# Patient Record
Sex: Female | Born: 1985 | Race: Black or African American | Hispanic: No | Marital: Single | Smoking: Never smoker
Health system: Southern US, Community
[De-identification: ages and names within clinical notes are randomized; demographics above are authoritative.]

## PROBLEM LIST (undated history)

## (undated) DIAGNOSIS — D649 Anemia, unspecified: Secondary | ICD-10-CM

---

## 2009-12-07 ENCOUNTER — Emergency Department (HOSPITAL_BASED_OUTPATIENT_CLINIC_OR_DEPARTMENT_OTHER)
Admission: EM | Admit: 2009-12-07 | Discharge: 2009-12-07 | Payer: Self-pay | Source: Home / Self Care | Admitting: Emergency Medicine

## 2010-06-26 LAB — COMPREHENSIVE METABOLIC PANEL
Albumin: 4.4 g/dL (ref 3.5–5.2)
CO2: 26 mEq/L (ref 19–32)
Chloride: 107 mEq/L (ref 96–112)
Glucose, Bld: 82 mg/dL (ref 70–99)

## 2010-06-26 LAB — DIFFERENTIAL
Basophils Relative: 3 % — ABNORMAL HIGH (ref 0–1)
Eosinophils Absolute: 0 10*3/uL (ref 0.0–0.7)
Lymphocytes Relative: 30 % (ref 12–46)
Monocytes Absolute: 0.3 10*3/uL (ref 0.1–1.0)

## 2010-06-26 LAB — URINALYSIS, ROUTINE W REFLEX MICROSCOPIC
Glucose, UA: NEGATIVE mg/dL
Specific Gravity, Urine: 1.014 (ref 1.005–1.030)
pH: 7.5 (ref 5.0–8.0)

## 2010-06-26 LAB — CBC
Hemoglobin: 10.4 g/dL — ABNORMAL LOW (ref 12.0–15.0)
MCHC: 31.4 g/dL (ref 30.0–36.0)
MCV: 69.7 fL — ABNORMAL LOW (ref 78.0–100.0)
Platelets: 327 10*3/uL (ref 150–400)
RBC: 4.77 MIL/uL (ref 3.87–5.11)
WBC: 5.5 10*3/uL (ref 4.0–10.5)

## 2010-06-26 LAB — URINE MICROSCOPIC-ADD ON

## 2010-06-26 LAB — PREGNANCY, URINE: Preg Test, Ur: NEGATIVE

## 2011-10-09 ENCOUNTER — Emergency Department (HOSPITAL_BASED_OUTPATIENT_CLINIC_OR_DEPARTMENT_OTHER)
Admission: EM | Admit: 2011-10-09 | Discharge: 2011-10-09 | Disposition: A | Discharge status: Home / Self Care | Payer: 59 | Attending: Emergency Medicine | Admitting: Emergency Medicine

## 2011-10-09 ENCOUNTER — Encounter (HOSPITAL_BASED_OUTPATIENT_CLINIC_OR_DEPARTMENT_OTHER): Payer: Self-pay

## 2011-10-09 DIAGNOSIS — N390 Urinary tract infection, site not specified: Secondary | ICD-10-CM | POA: Insufficient documentation

## 2011-10-09 DIAGNOSIS — R109 Unspecified abdominal pain: Secondary | ICD-10-CM | POA: Insufficient documentation

## 2011-10-09 DIAGNOSIS — N72 Inflammatory disease of cervix uteri: Secondary | ICD-10-CM | POA: Insufficient documentation

## 2011-10-09 HISTORY — DX: Anemia, unspecified: D64.9

## 2011-10-09 LAB — WET PREP, GENITAL: Trich, Wet Prep: NONE SEEN

## 2011-10-09 LAB — URINALYSIS, ROUTINE W REFLEX MICROSCOPIC
Bilirubin Urine: NEGATIVE
Glucose, UA: NEGATIVE mg/dL
Ketones, ur: NEGATIVE mg/dL
Nitrite: NEGATIVE
Protein, ur: NEGATIVE mg/dL
Specific Gravity, Urine: 1.024 (ref 1.005–1.030)
Urobilinogen, UA: 0.2 mg/dL (ref 0.0–1.0)
pH: 7 (ref 5.0–8.0)

## 2011-10-09 LAB — PREGNANCY, URINE: Preg Test, Ur: NEGATIVE

## 2011-10-09 LAB — URINE MICROSCOPIC-ADD ON

## 2011-10-09 MED ORDER — METRONIDAZOLE 500 MG PO TABS
2000.0000 mg | ORAL_TABLET | Freq: Once | ORAL | Status: AC
  Administered 2011-10-09: 2000 mg via ORAL
  Filled 2011-10-09: qty 4

## 2011-10-09 MED ORDER — CEPHALEXIN 500 MG PO CAPS: 500.0000 mg | ORAL_CAPSULE | Freq: Four times a day (QID) | ORAL | Status: AC

## 2011-10-09 MED ORDER — AZITHROMYCIN 250 MG PO TABS
1000.0000 mg | ORAL_TABLET | Freq: Once | ORAL | Status: AC
  Administered 2011-10-09: 1000 mg via ORAL
  Filled 2011-10-09: qty 4

## 2011-10-09 MED ORDER — CEFTRIAXONE SODIUM 250 MG IJ SOLR
250.0000 mg | Freq: Once | INTRAMUSCULAR | Status: AC
  Administered 2011-10-09: 250 mg via INTRAMUSCULAR
  Filled 2011-10-09: qty 250

## 2011-10-09 MED ORDER — FLUCONAZOLE 150 MG PO TABS: ORAL_TABLET | ORAL | Status: DC

## 2011-10-09 NOTE — ED Provider Notes (Signed)
History     CSN: [id number]  Arrival date & time 10/09/11  4098   First MD Initiated Contact with Patient 10/09/11 1017      Chief Complaint  Patient presents with  . Abdominal Pain    (Consider location/radiation/quality/duration/timing/severity/associated sxs/prior treatment) HPI Patient is a 26 year old female who presents today complaining of suprapubic pain as well as vaginal discharge and increased urinary frequency. Patient was seen recently at another facility and diagnosed with UTI. She was treated with an antibiotic which she cannot recall the name of. She reported that she stopped taking this antibiotic after she started feeling better. She did not complete her course. Patient also reports dyspareunia with a new sexual partner last week. They engaged in unprotected vaginal intercourse. Patient has never had pain like this before and did not feel that her partner was anatomically significantly different from previous partners. Patient has no history of sexual transmitted diseases or pregnancy. She was hemodynamically stable and denied nausea, vomiting, or fevers. Patient has not taken a thing for her symptoms. There are no other associated or modifying factors. Past Medical History  Diagnosis Date  . Anemia     History reviewed. No pertinent past surgical history.  History reviewed. No pertinent family history.  History  Substance Use Topics  . Smoking status: Never Smoker   . Smokeless tobacco: Not on file  . Alcohol Use: No    OB History    Grav Para Term Preterm Abortions TAB SAB Ect Mult Living                  Review of Systems  Constitutional: Negative.   HENT: Negative.   Eyes: Negative.   Respiratory: Negative.   Cardiovascular: Negative.   Gastrointestinal: Positive for abdominal pain.  Genitourinary: Positive for frequency, vaginal discharge and dyspareunia.  Musculoskeletal: Negative.   Skin: Negative.   Neurological: Negative.   Hematological:  Negative.   Psychiatric/Behavioral: Negative.   All other systems reviewed and are negative.    Allergies  Review of patient's allergies indicates no known allergies.  Home Medications   Current Outpatient Rx  Name Route Sig Dispense Refill  . CEPHALEXIN 500 MG PO CAPS Oral Take 1 capsule (500 mg total) by mouth 4 (four) times daily. 40 capsule 0    BP 135/67  Pulse 84  Temp 98.3 F (36.8 C) (Oral)  Resp 15  Ht 5\' 2"  (1.575 m)  Wt 260 lb (117.935 kg)  BMI 47.55 kg/m2  SpO2 100%  LMP 09/18/2011  Physical Exam  Nursing note and vitals reviewed. GEN: Well-developed, well-nourished female in no distress HEENT: Atraumatic, normocephalic. Oropharynx clear without erythema EYES: PERRLA BL, no scleral icterus. NECK: Trachea midline, no meningismus CV: regular rate and rhythm. No murmurs, rubs, or gallops PULM: No respiratory distress.  No crackles, wheezes, or rales. GI: soft, mild suprapubic tenderness. No guarding, rebound. + bowel sounds  GU: Moderate amount of yellowish vaginal discharge. Cervical motion tenderness noted. No adnexal motion tenderness. Neuro: cranial nerves grossly 2-12 intact, no abnormalities of strength or sensation, A and O x 3 MSK: Patient moves all 4 extremities symmetrically, no deformity, edema, or injury noted Skin: No rashes petechiae, purpura, or jaundice Psych: no abnormality of mood   ED Course  Procedures (including critical care time)  Labs Reviewed  URINALYSIS, ROUTINE W REFLEX MICROSCOPIC - Abnormal; Notable for the following:    APPearance CLOUDY (*)     Hgb urine dipstick LARGE (*)     Leukocytes,  UA LARGE (*)     All other components within normal limits  WET PREP, GENITAL - Abnormal; Notable for the following:    Clue Cells Wet Prep HPF POC FEW (*)     WBC, Wet Prep HPF POC TOO NUMEROUS TO COUNT (*)     All other components within normal limits  URINE MICROSCOPIC-ADD ON - Abnormal; Notable for the following:    Squamous  Epithelial / LPF MANY (*)     Bacteria, UA MANY (*)     All other components within normal limits  PREGNANCY, URINE  GC/CHLAMYDIA PROBE AMP, GENITAL  URINE CULTURE   No results found.   1. UTI (urinary tract infection)   2. Cervicitis       MDM  Patient was evaluated by myself. Based on evaluation patient did have urinalysis performed which showed large amounts of leukocytes and white blood cells. Patient was having urinary symptoms but given her exposure and dyspareunia a pelvic exam was performed by myself. This was consistent with cervicitis. Patient was notified of my concern. Her pregnancy test was negative. Patient was treated with Rocephin, azithromycin, and Flagyl here. She was told that she should notify her partners for the possibility of sexual transmitted infection. Patient was advised to engage only in protected sexual intercourse. Gonorrhea and Chlamydia swabs are pending. Patient was also treated for urinary tract infection as her symptoms were somewhat similar to previous. A urine culture was also sent. Patient was given prescription for Keflex. She also requests a prescription for Diflucan in case she develops yeast infection. I was happy to provide this. Patient was discharged in good condition.        Cyndra Numbers, MD 10/10/11 1721

## 2011-10-09 NOTE — ED Notes (Signed)
Pt c/o suprapubic abdominal cramping.  Pt states she was recently DX and TX for UTI, completed ABX but still has some abdominal cramping.  Pt denies dysuria, hematuria, increased frequency.  Pt denies fever.

## 2011-10-09 NOTE — Discharge Instructions (Signed)
Cervicitis Cervicitis is a soreness and swelling (inflammation) of the cervix. Your cervix is located at the bottom of your uterus which opens up to the vagina.  CAUSES   Sexually transmitted infections (STIs).   Allergic reaction.   Medicines or birth control devices that are put in the vagina.   Injury to the cervix.   Bacterial infections.  SYMPTOMS  There may be no symptoms. If symptoms occur, they may include:  Grey, white, yellow, or bad smelling vaginal discharge.   Pain or itching of the area outside the vagina.   Painful sexual intercourse.   Lower abdominal or lower back pain, especially during intercourse.   Frequent urination.   Abnormal vaginal bleeding between periods, after sexual intercourse, or after menopause.   Pressure or a heavy feeling in the pelvis.  DIAGNOSIS  Diagnosis is made after a pelvic exam. Other tests may include:  Examination of any discharge under a microscope (wet prep).   A Pap test.  TREATMENT  Treatment will depend on the cause of cervicitis. If it is caused by an STI, both you and your partner will need to be treated. Antibiotic medicines will be given. HOME CARE INSTRUCTIONS   Do not have sexual intercourse until your caregiver says it is okay.   Do not have sexual intercourse until your partner has been treated if your cervicitis is caused by an STI.   Take your antibiotics as directed. Finish them even if you start to feel better.  SEEK IMMEDIATE MEDICAL CARE IF:   Your symptoms come back.   You have a fever.   You experience any problems that may be related to the medicine you are taking.  MAKE SURE YOU:   Understand these instructions.   Will watch your condition.   Will get help right away if you are not doing well or get worse.  Document Released: 03/29/2005 Document Revised: 03/18/2011 Document Reviewed: 10/26/2010 [facility] Patient Information 2012 [place], [place].Urinary Tract Infection Infections of the  urinary tract can start in several places. A bladder infection (cystitis), a kidney infection (pyelonephritis), and a prostate infection (prostatitis) are different types of urinary tract infections (UTIs). They usually get better if treated with medicines (antibiotics) that kill germs. Take all the medicine until it is gone. You or your child may feel better in a few days, but TAKE ALL MEDICINE or the infection may not respond and may become more difficult to treat. HOME CARE INSTRUCTIONS   Drink enough water and fluids to keep the urine clear or pale yellow. Cranberry juice is especially recommended, in addition to large amounts of water.   Avoid caffeine, tea, and carbonated beverages. They tend to irritate the bladder.   Alcohol may irritate the prostate.   Only take over-the-counter or prescription medicines for pain, discomfort, or fever as directed by your caregiver.  To prevent further infections:  Empty the bladder often. Avoid holding urine for long periods of time.   After a bowel movement, women should cleanse from front to back. Use each tissue only once.   Empty the bladder before and after sexual intercourse.  FINDING OUT THE RESULTS OF YOUR TEST Not all test results are available during your visit. If your or your child's test results are not back during the visit, make an appointment with your caregiver to find out the results. Do not assume everything is normal if you have not heard from your caregiver or the medical facility. It is important for you to follow up  on all test results. SEEK MEDICAL CARE IF:   There is back pain.   Your baby is older than 3 months with a rectal temperature of 100.5 F (38.1 C) or higher for more than 1 day.   Your or your child's problems (symptoms) are no better in 3 days. Return sooner if you or your child is getting worse.  SEEK IMMEDIATE MEDICAL CARE IF:   There is severe back pain or lower abdominal pain.   You or your child  develops chills.   You have a fever.   Your baby is older than 3 months with a rectal temperature of 102 F (38.9 C) or higher.   Your baby is 94 months old or younger with a rectal temperature of 100.4 F (38 C) or higher.   There is nausea or vomiting.   There is continued burning or discomfort with urination.  MAKE SURE YOU:   Understand these instructions.   Will watch your condition.   Will get help right away if you are not doing well or get worse.  Document Released: 01/06/2005 Document Revised: 03/18/2011 Document Reviewed: 08/11/2006 [facility] Patient Information 2012 [place], [place].

## 2011-10-10 LAB — URINE CULTURE: Colony Count: 25000

## 2011-10-12 LAB — GC/CHLAMYDIA PROBE AMP, GENITAL
Chlamydia, DNA Probe: POSITIVE — AB
GC Probe Amp, Genital: NEGATIVE

## 2011-10-13 NOTE — ED Notes (Signed)
+   chlamydia Patient treated with  Rocephin and Zithromax letter faxed

## 2011-10-14 NOTE — ED Notes (Signed)
Voice mail message left for patient to return call. 

## 2011-10-16 NOTE — ED Notes (Signed)
Attempted to contact patient. No answer. Left voicemail for patient to call back. °

## 2011-10-17 NOTE — ED Notes (Signed)
Attempted to contact patient. No answer. Left voicemail for patient to call back. Sent letter after no answer x 3. °

## 2012-05-05 ENCOUNTER — Encounter (HOSPITAL_BASED_OUTPATIENT_CLINIC_OR_DEPARTMENT_OTHER): Payer: Self-pay | Admitting: *Deleted

## 2012-05-05 ENCOUNTER — Emergency Department (HOSPITAL_BASED_OUTPATIENT_CLINIC_OR_DEPARTMENT_OTHER)
Admission: EM | Admit: 2012-05-05 | Discharge: 2012-05-05 | Disposition: A | Discharge status: Home / Self Care | Payer: 59 | Attending: Emergency Medicine | Admitting: Emergency Medicine

## 2012-05-05 DIAGNOSIS — Z3202 Encounter for pregnancy test, result negative: Secondary | ICD-10-CM | POA: Insufficient documentation

## 2012-05-05 DIAGNOSIS — N72 Inflammatory disease of cervix uteri: Secondary | ICD-10-CM

## 2012-05-05 DIAGNOSIS — N390 Urinary tract infection, site not specified: Secondary | ICD-10-CM

## 2012-05-05 DIAGNOSIS — Z862 Personal history of diseases of the blood and blood-forming organs and certain disorders involving the immune mechanism: Secondary | ICD-10-CM | POA: Insufficient documentation

## 2012-05-05 LAB — WET PREP, GENITAL
Trich, Wet Prep: NONE SEEN
Yeast Wet Prep HPF POC: NONE SEEN

## 2012-05-05 LAB — URINE MICROSCOPIC-ADD ON

## 2012-05-05 LAB — PREGNANCY, URINE: Preg Test, Ur: NEGATIVE

## 2012-05-05 LAB — URINALYSIS, ROUTINE W REFLEX MICROSCOPIC: Ketones, ur: NEGATIVE mg/dL

## 2012-05-05 MED ORDER — AZITHROMYCIN 250 MG PO TABS
1000.0000 mg | ORAL_TABLET | Freq: Once | ORAL | Status: AC
  Administered 2012-05-05: 1000 mg via ORAL
  Filled 2012-05-05: qty 4

## 2012-05-05 MED ORDER — CEFTRIAXONE SODIUM 250 MG IJ SOLR
250.0000 mg | Freq: Once | INTRAMUSCULAR | Status: AC
  Administered 2012-05-05: 250 mg via INTRAMUSCULAR
  Filled 2012-05-05: qty 250

## 2012-05-05 MED ORDER — CEPHALEXIN 500 MG PO CAPS: 500.0000 mg | ORAL_CAPSULE | Freq: Four times a day (QID) | ORAL | Status: DC

## 2012-05-05 MED ORDER — FLUCONAZOLE 100 MG PO TABS: 100.0000 mg | ORAL_TABLET | Freq: Every day | ORAL | Status: AC
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
## 2012-05-05 NOTE — ED Provider Notes (Signed)
Medical screening examination/treatment/procedure(s) were performed by non-physician practitioner and as supervising physician I was immediately available for consultation/collaboration.   Taraneh Metheney III, MD 05/05/12 2315 

## 2012-05-05 NOTE — ED Notes (Signed)
Pt c/o lower abd pain  With vaginal discharge x 5 days

## 2012-05-07 LAB — URINE CULTURE: Colony Count: >=100000

## 2012-05-07 LAB — GC/CHLAMYDIA PROBE AMP
CT Probe RNA: POSITIVE — AB
GC Probe RNA: NEGATIVE

## 2012-05-08 NOTE — ED Notes (Signed)
+  Chlamydia. Patient treated with Rocephin and Zithromax. [employer] faxed. 

## 2012-05-10 NOTE — ED Notes (Signed)
Patient informed of positive results after id'd x 2 and informed of need to notify partner to be treated. 

## 2013-04-20 ENCOUNTER — Encounter: Payer: Self-pay | Admitting: Obstetrics & Gynecology

## 2013-04-23 ENCOUNTER — Encounter: Payer: Self-pay | Admitting: Obstetrics & Gynecology

## 2016-09-27 ENCOUNTER — Other Ambulatory Visit: Payer: Self-pay | Admitting: Physician Assistant

## 2016-09-27 DIAGNOSIS — R1032 Left lower quadrant pain: Secondary | ICD-10-CM

## 2016-09-27 DIAGNOSIS — R102 Pelvic and perineal pain: Secondary | ICD-10-CM

## 2016-10-05 ENCOUNTER — Other Ambulatory Visit: Payer: Self-pay

## 2016-10-26 ENCOUNTER — Ambulatory Visit
Admission: RE | Admit: 2016-10-26 | Discharge: 2016-10-26 | Disposition: A | Discharge status: Home / Self Care | Payer: No Typology Code available for payment source | Source: Ambulatory Visit | Attending: Physician Assistant | Admitting: Physician Assistant

## 2016-10-26 DIAGNOSIS — R102 Pelvic and perineal pain: Secondary | ICD-10-CM

## 2016-12-22 ENCOUNTER — Emergency Department (HOSPITAL_BASED_OUTPATIENT_CLINIC_OR_DEPARTMENT_OTHER)
Admission: EM | Admit: 2016-12-22 | Discharge: 2016-12-23 | Disposition: A | Discharge status: Home / Self Care | Payer: No Typology Code available for payment source | Attending: Emergency Medicine | Admitting: Emergency Medicine

## 2016-12-22 ENCOUNTER — Encounter (HOSPITAL_BASED_OUTPATIENT_CLINIC_OR_DEPARTMENT_OTHER): Payer: Self-pay

## 2016-12-22 DIAGNOSIS — M25511 Pain in right shoulder: Secondary | ICD-10-CM | POA: Diagnosis not present

## 2016-12-22 DIAGNOSIS — M25512 Pain in left shoulder: Secondary | ICD-10-CM | POA: Diagnosis not present
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
## 2016-12-23 MED ORDER — NAPROXEN 375 MG PO TABS
375.0000 mg | ORAL_TABLET | Freq: Two times a day (BID) | ORAL | 0 refills | Status: AC
Start: 2016-12-23 — End: ?

## 2016-12-23 MED ORDER — METHOCARBAMOL 500 MG PO TABS: 500.0000 mg | ORAL_TABLET | Freq: Two times a day (BID) | ORAL | 0 refills | Status: AC

## 2016-12-23 MED ORDER — METHOCARBAMOL 500 MG PO TABS
1000.0000 mg | ORAL_TABLET | Freq: Once | ORAL | Status: AC
  Administered 2016-12-23: 1000 mg via ORAL
  Filled 2016-12-23: qty 2

## 2016-12-23 MED ORDER — NAPROXEN 250 MG PO TABS
500.0000 mg | ORAL_TABLET | Freq: Once | ORAL | Status: AC
  Administered 2016-12-23: 500 mg via ORAL
  Filled 2016-12-23: qty 2
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
## 2018-04-07 IMAGING — US US PELVIS COMPLETE
1 series · 14 of 25 positions shown · non-contrast
Comparison: No recent prior.

CLINICAL DATA: Pelvic pain.

EXAM:
TRANSABDOMINAL AND TRANSVAGINAL ULTRASOUND OF PELVIS
TECHNIQUE: Both transabdominal and transvaginal ultrasound examinations of the
pelvis were performed. Transabdominal technique was performed for
global imaging of the pelvis including uterus, ovaries, adnexal
regions, and pelvic cul-de-sac. It was necessary to proceed with
endovaginal exam following the transabdominal exam to visualize the
uterus .

[Series 1: us pelvis complete · 0.25mm/px · 14 of 93 slices shown]
[im 1/93]
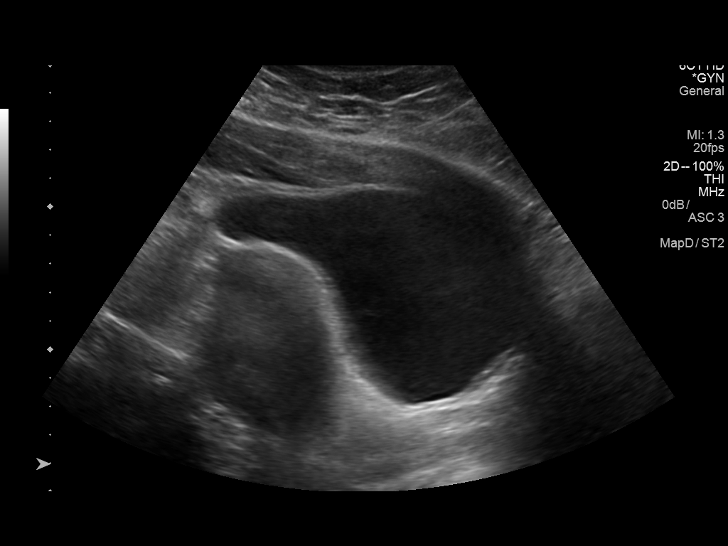
[im 8/93]
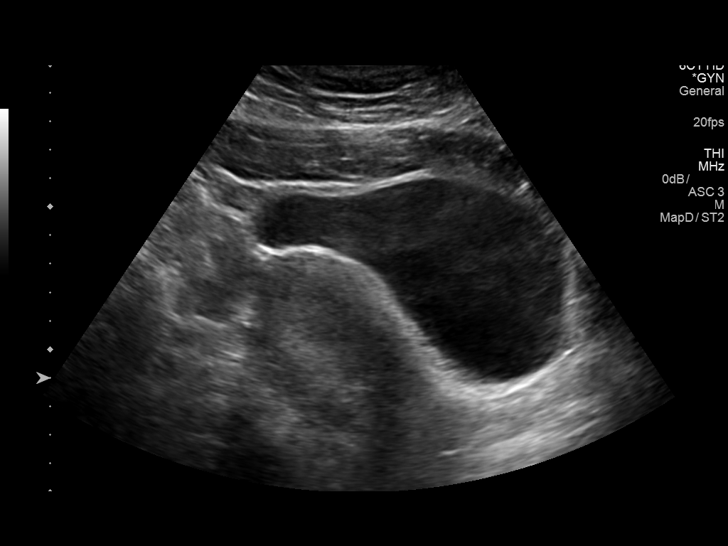
[im 16/93]
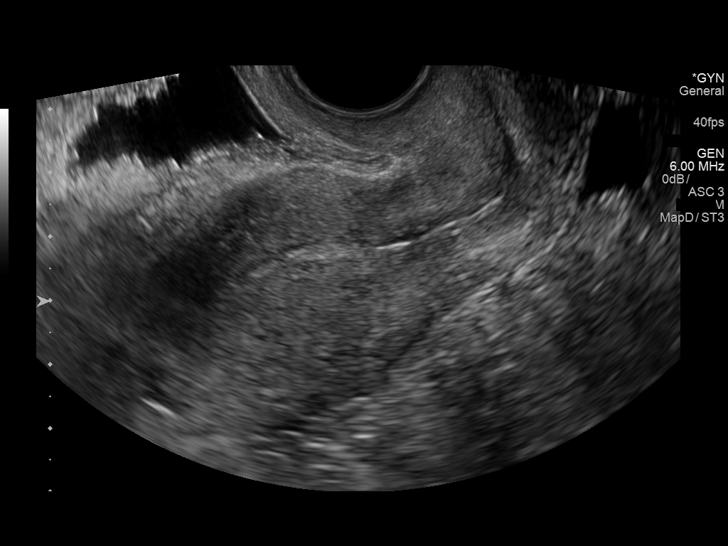
[im 24/93]
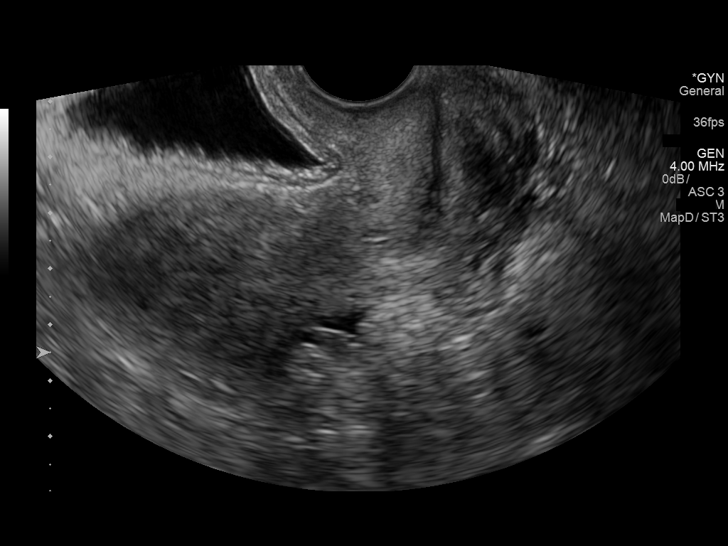
[im 31/93]
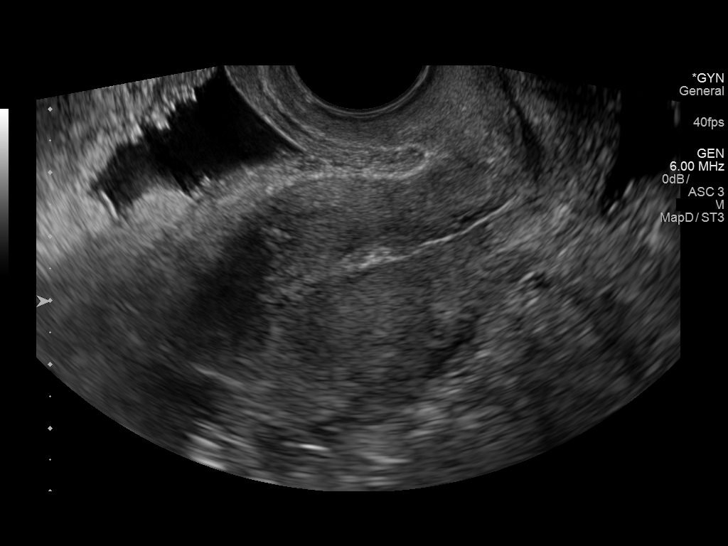
[im 35/93]
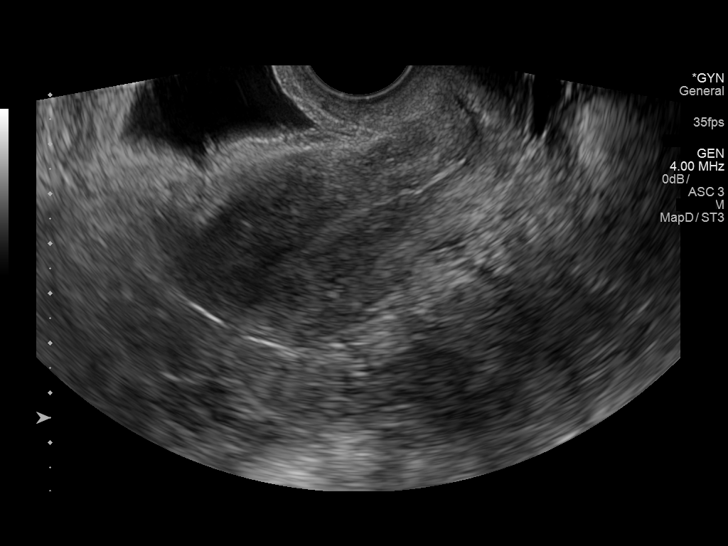
[im 43/93]
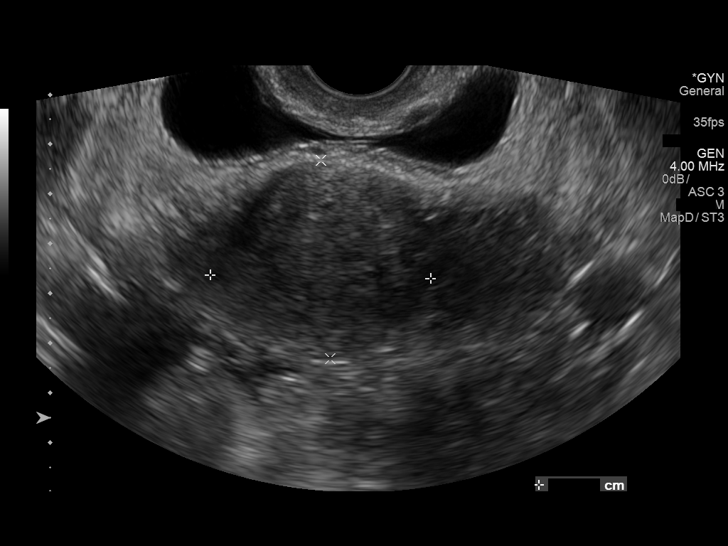
[im 50/93]
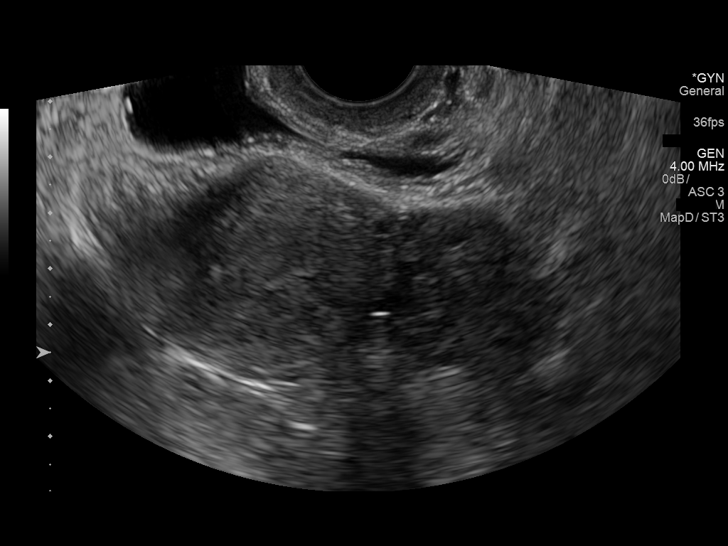
[im 58/93]
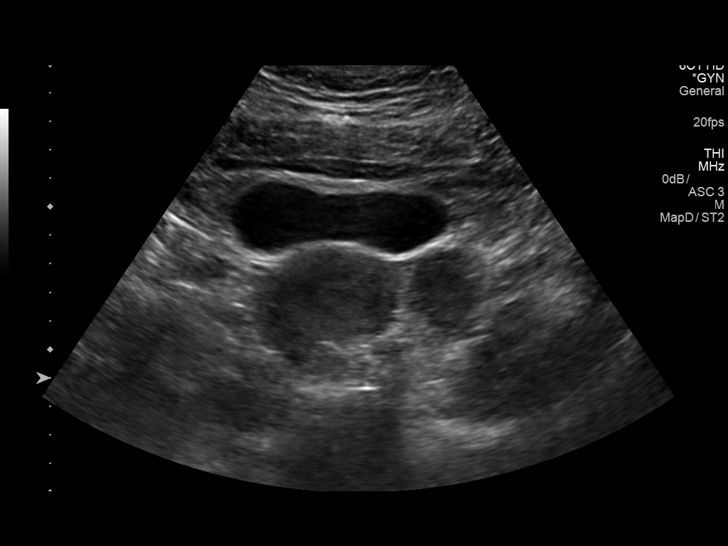
[im 62/93]
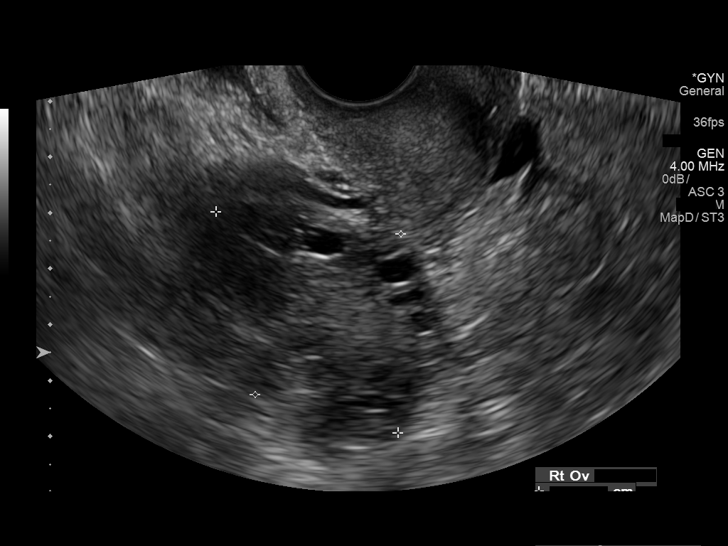
[im 70/93]
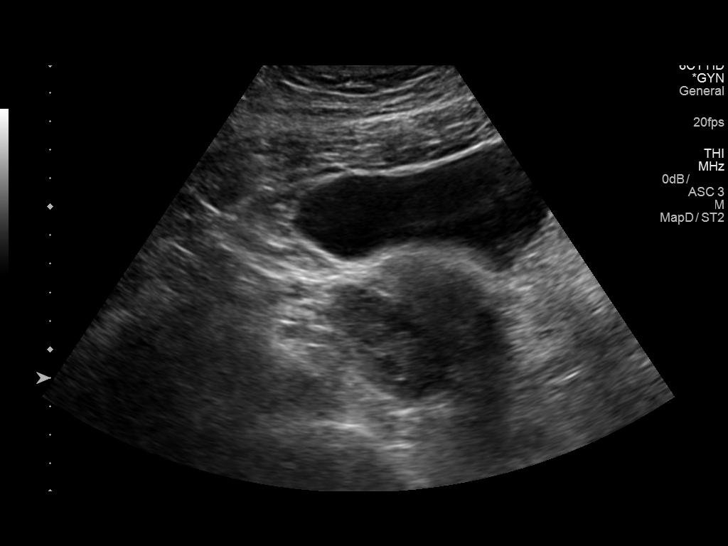
[im 77/93]
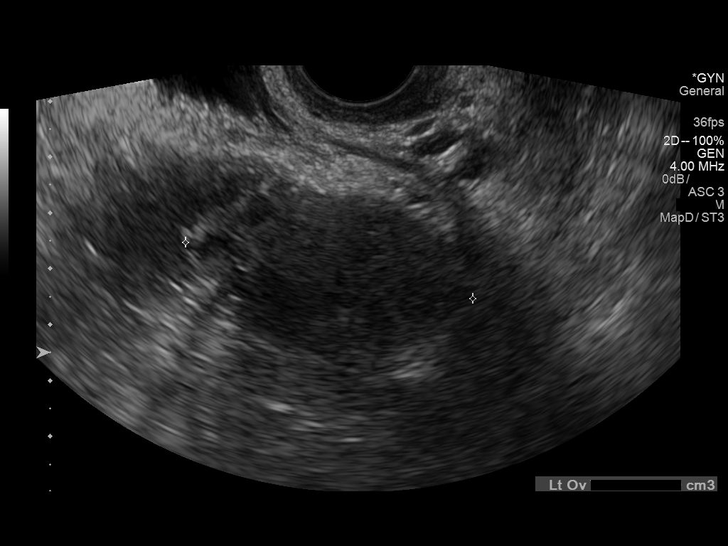
[im 85/93]
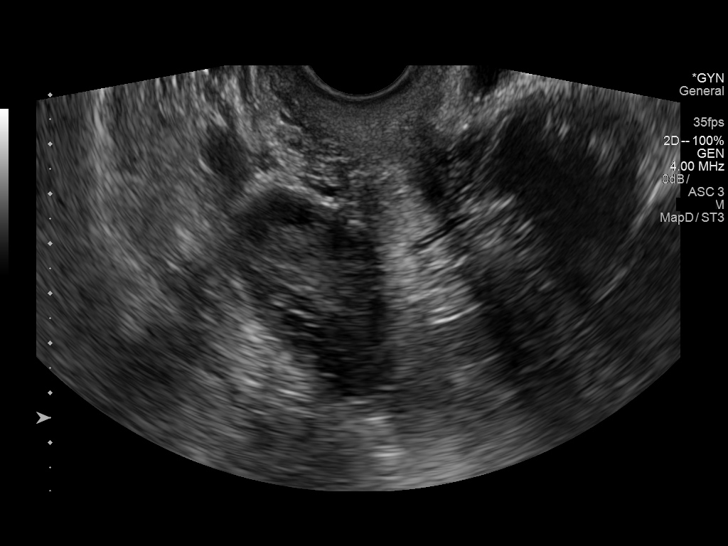
[im 93/93]
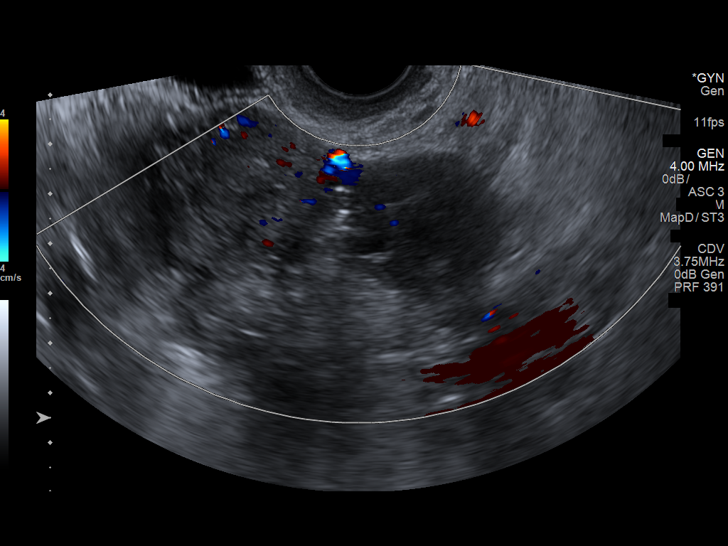

[14 of 25 positions shown; findings below may reference images not displayed]

FINDINGS: Uterus

Measurements: 10 x 3.3 x 4.1 cm. No fibroids or other mass
visualized.

Endometrium

Thickness: 12 mm.  No focal abnormality visualized.

Right ovary

Measurements: 5.3 x 0.4 x 5.3 cm. Normal appearance/no adnexal mass.

Left ovary

Measurements: 5.5 x 3.7 x 4.1 cm. Normal appearance/no adnexal mass.

Other findings

Trace free fluid.  Limited exam due to patient's body habitus.
IMPRESSION: Trace free pelvic fluid.  Exam is otherwise unremarkable.
